# Patient Record
Sex: Female | Born: 1970 | Race: Black or African American | Hispanic: No | Marital: Single
Health system: Southern US, Community
[De-identification: ages and names within clinical notes are randomized; demographics above are authoritative.]

---

## 2010-08-05 ENCOUNTER — Emergency Department: Payer: Self-pay | Admitting: Emergency Medicine

## 2011-03-12 ENCOUNTER — Emergency Department: Payer: Self-pay | Admitting: Emergency Medicine

## 2011-03-12 LAB — URINALYSIS, COMPLETE
Bilirubin,UR: NEGATIVE
Glucose,UR: NEGATIVE mg/dL (ref 0–75)
Ketone: NEGATIVE
Leukocyte Esterase: NEGATIVE
Ph: 7 (ref 4.5–8.0)
Protein: NEGATIVE
RBC,UR: <1 /HPF (ref 0–5)
Specific Gravity: 1.008 (ref 1.003–1.030)
WBC UR: 1 /HPF (ref 0–5)

## 2011-03-12 LAB — COMPREHENSIVE METABOLIC PANEL
Albumin: 3.7 g/dL (ref 3.4–5.0)
Anion Gap: 11 (ref 7–16)
BUN: 8 mg/dL (ref 7–18)
Bilirubin,Total: 0.4 mg/dL (ref 0.2–1.0)
Chloride: 108 mmol/L — ABNORMAL HIGH (ref 98–107)
Creatinine: 0.99 mg/dL (ref 0.60–1.30)
EGFR (African American): >60
Osmolality: 287 (ref 275–301)
Potassium: 4.1 mmol/L (ref 3.5–5.1)
SGOT(AST): 15 U/L (ref 15–37)
SGPT (ALT): 23 U/L
Sodium: 145 mmol/L (ref 136–145)
Total Protein: 7.7 g/dL (ref 6.4–8.2)

## 2011-03-12 LAB — CBC
MCH: 28 pg (ref 26.0–34.0)
MCV: 85 fL (ref 80–100)
Platelet: 206 10*3/uL (ref 150–440)
RBC: 4.15 10*6/uL (ref 3.80–5.20)
RDW: 16.7 % — ABNORMAL HIGH (ref 11.5–14.5)
WBC: 7.7 10*3/uL (ref 3.6–11.0)

## 2012-06-13 ENCOUNTER — Emergency Department: Payer: Self-pay | Admitting: Emergency Medicine

## 2012-06-13 LAB — BASIC METABOLIC PANEL
Anion Gap: 7 (ref 7–16)
Calcium, Total: 8.6 mg/dL (ref 8.5–10.1)
Chloride: 109 mmol/L — ABNORMAL HIGH (ref 98–107)
Creatinine: 0.81 mg/dL (ref 0.60–1.30)
Glucose: 96 mg/dL (ref 65–99)
Potassium: 3.4 mmol/L — ABNORMAL LOW (ref 3.5–5.1)
Sodium: 141 mmol/L (ref 136–145)

## 2012-06-13 LAB — CBC
HCT: 31.2 % — ABNORMAL LOW (ref 35.0–47.0)
HGB: 10.1 g/dL — ABNORMAL LOW (ref 12.0–16.0)
MCHC: 32.2 g/dL (ref 32.0–36.0)
MCV: 76 fL — ABNORMAL LOW (ref 80–100)
Platelet: 179 10*3/uL (ref 150–440)

## 2012-06-13 LAB — TROPONIN I: Troponin-I: <0.02 ng/mL

## 2012-06-14 LAB — HCG, QUANTITATIVE, PREGNANCY: Beta Hcg, Quant.: <1 m[IU]/mL — ABNORMAL LOW

## 2014-05-13 NOTE — Consult Note (Signed)
Brief Consult Note: Diagnosis: Abdominal pain.   Patient was seen by consultant.   Consult note dictated.   Discussed with Attending MD.   Comments: Pt with bloating and pain that started day of D&C (one week ago).  Has also been very constipated.  She had vaginal bleeding up until yesterday.  She has been afebrile without abnormal vaginal discharge. Distribution of pain is bilateral flanks and suprapubic area with left >> right.  Workup in ED shows no fever, normal WBC count, normal UA. U/S concerning for either retained products of conception or hemorrhagic material.  My estimation is that she may have some retained products of conception and possible hemorrhagic material.  However, neither of these appear to be the source of her pain based on my exam and history.  She, therefore, doesn't warrant an emergency surgery to remove any intrauterine contents.  Recommend obtaining a quantitative beta hCG for baseline and a course of methergine 0.2mg  q 6 hours for 48 hours.  I personally discussed a follow up plan with her for her D&C; including seeing a gynecologist at the beginning of next week or even today (Friday) who can follow her quantitative hCG until it goes away or who can perform a follow up D&C to remove any retained products of conception before they become infected or cause other issues.  I personally conveyed this information to Dr. Manson PasseyBrown, attending in the ER.  Electronic Signatures: Conard NovakJackson, Milly Goggins D (MD)  (Signed [id number]-Feb-13 03:32)  Authored: Brief Consult Note   Last Updated: 22-Feb-13 03:32 by Conard NovakJackson, Lessly Stigler D (MD)

## 2014-07-11 IMAGING — CR DG CHEST 2V
1 series · 2 of 2 positions shown · non-contrast
Comparison: none

REASON FOR EXAM: Chest Pain
COMMENTS:

PROCEDURE:     DXR - DXR CHEST PA (OR AP) AND LATERAL  - June 13, 2012  [DATE]
RESULT:     Comparison: None.

[Series 1: pa · 0.17mm/px · 2 of 2 slices shown]
[im 1/2]
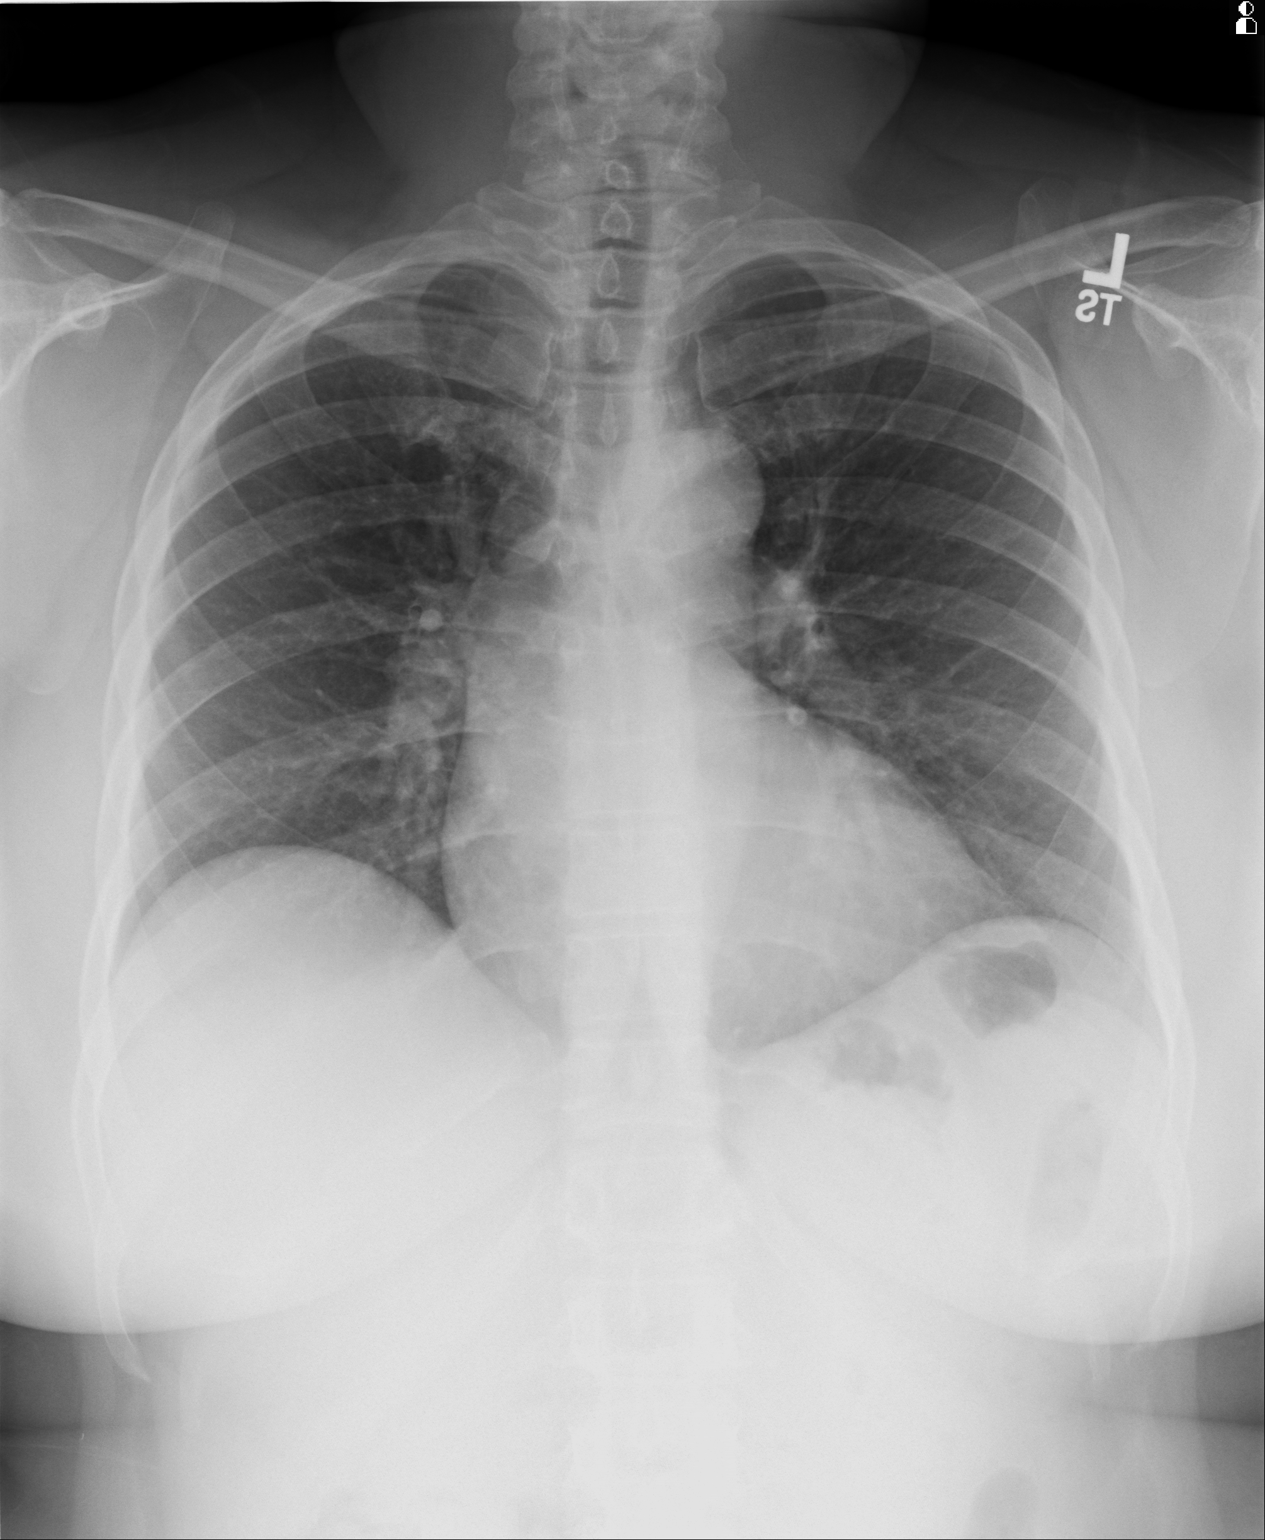
[im 2/2]
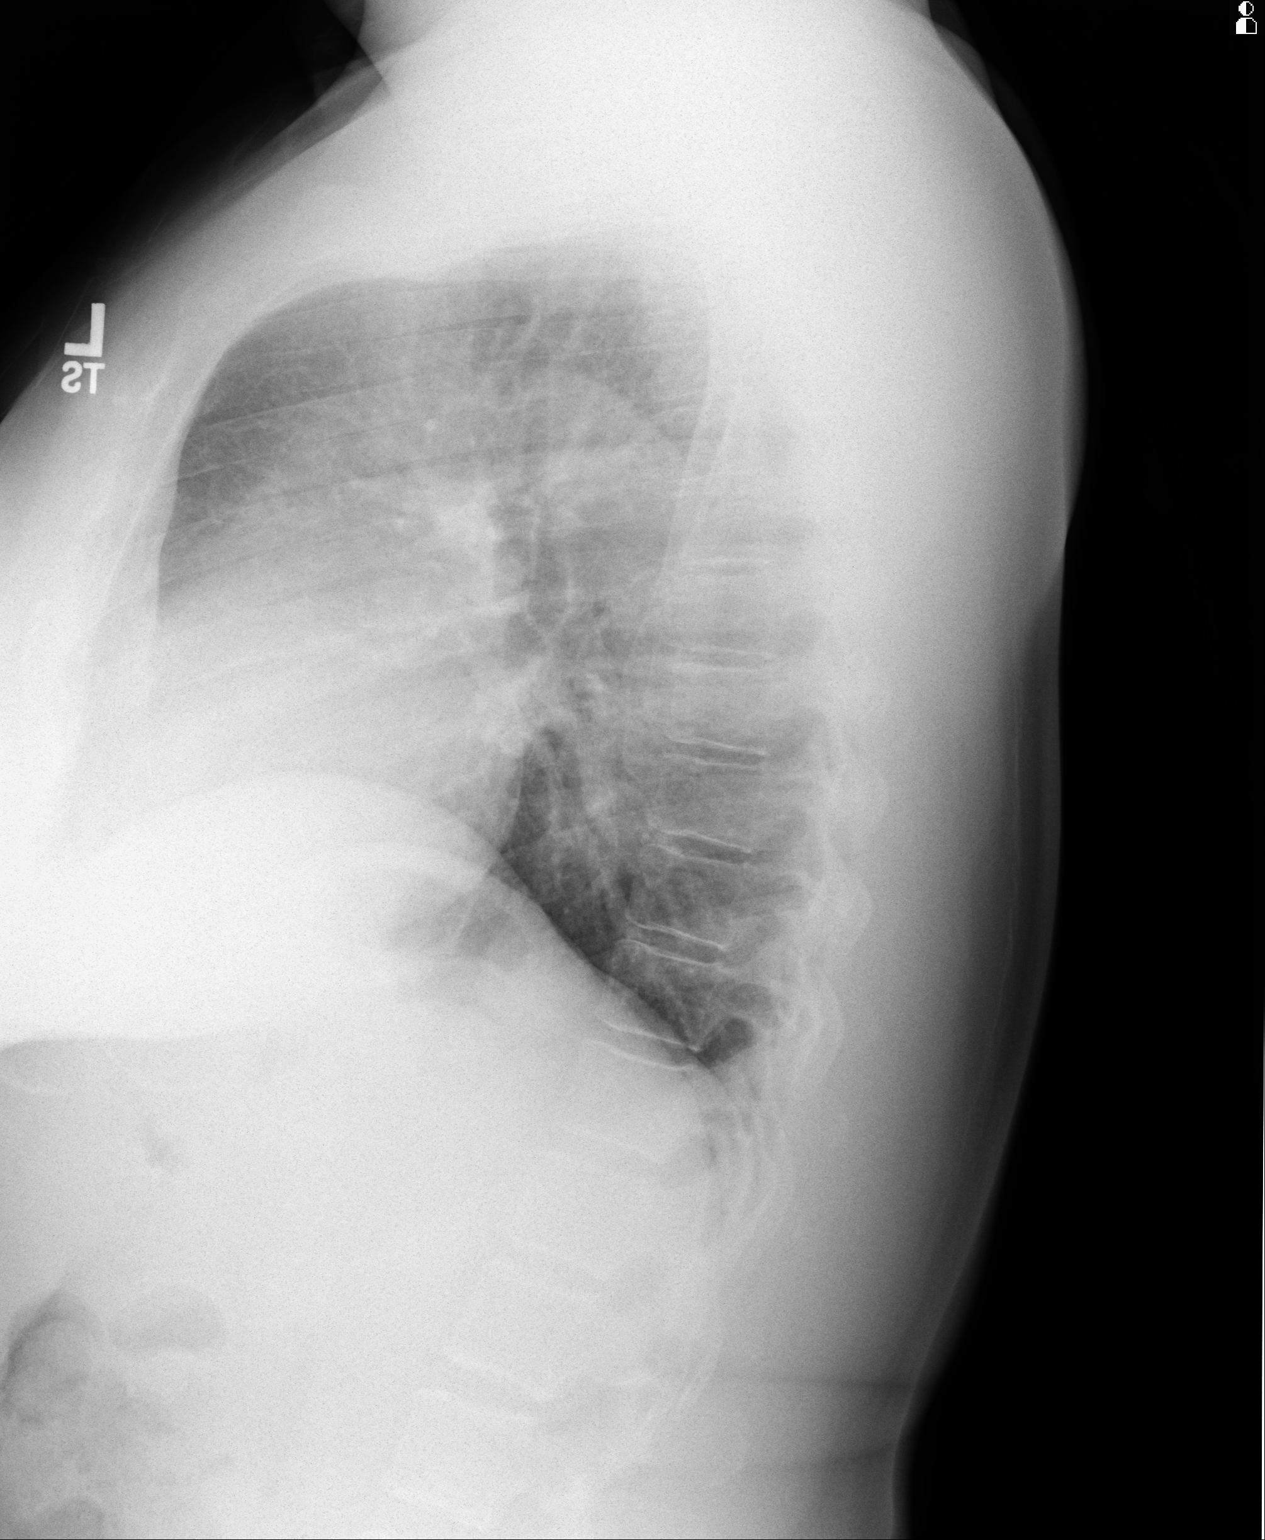

[2 of 2 positions shown; findings below may reference images not displayed]

FINDINGS: The heart and mediastinum are within normal limits given the slightly
diminished lung volumes. No focal pulmonary opacities.
IMPRESSION: No acute cardiopulmonary disease.

[REDACTED]

## 2014-07-12 IMAGING — US ABDOMEN ULTRASOUND LIMITED
1 series · 14 of 25 positions shown · non-contrast
Comparison: none

REASON FOR EXAM: RUQ pain nausea
COMMENTS:   Body Site: GB and Fossa, CBD, Head of Pancreas

PROCEDURE:     US  - US ABDOMEN LIMITED SURVEY  - June 14, 2012  [DATE]
RESULT:     History: Right upper quadrant pain.
Comparison Study: No prior.

[Series 1: abdomen ultrasound limited · 0.28mm/px · 14 of 65 slices shown]
[im 1/65]
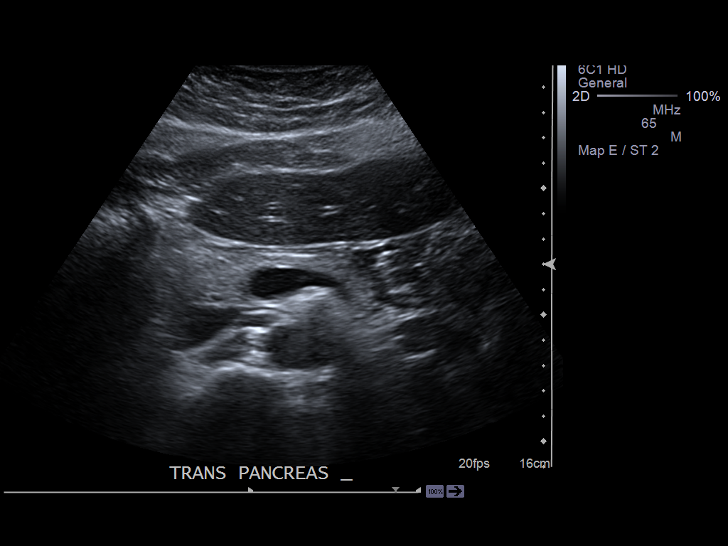
[im 6/65]
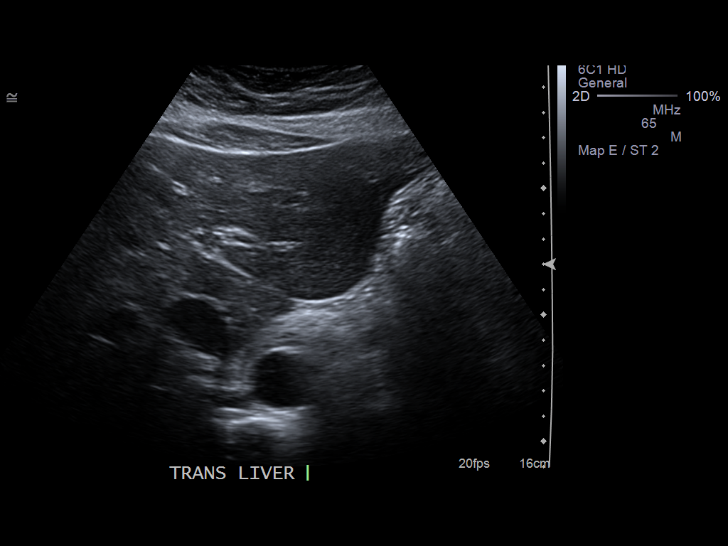
[im 11/65]
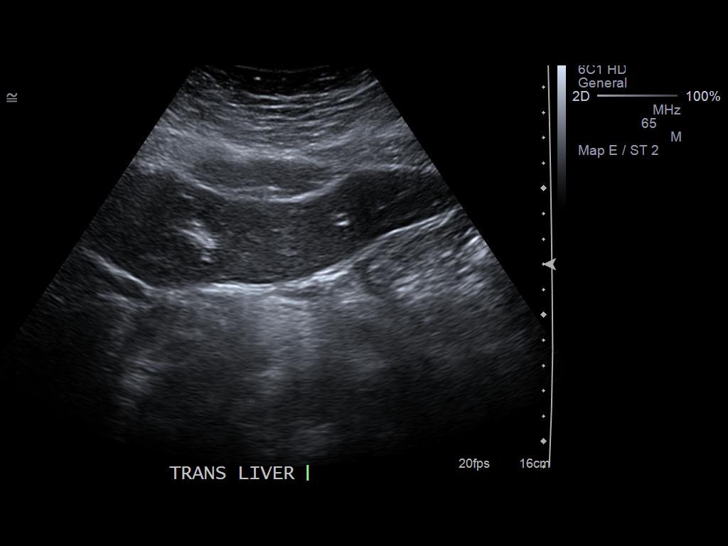
[im 17/65]
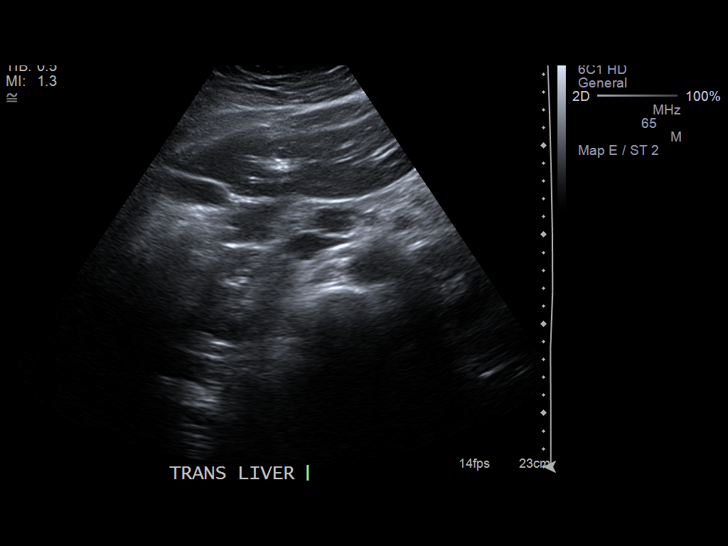
[im 22/65]
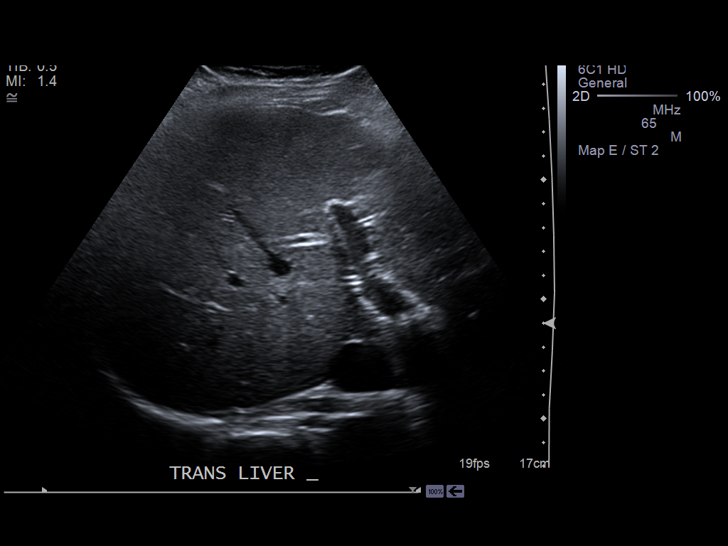
[im 25/65]
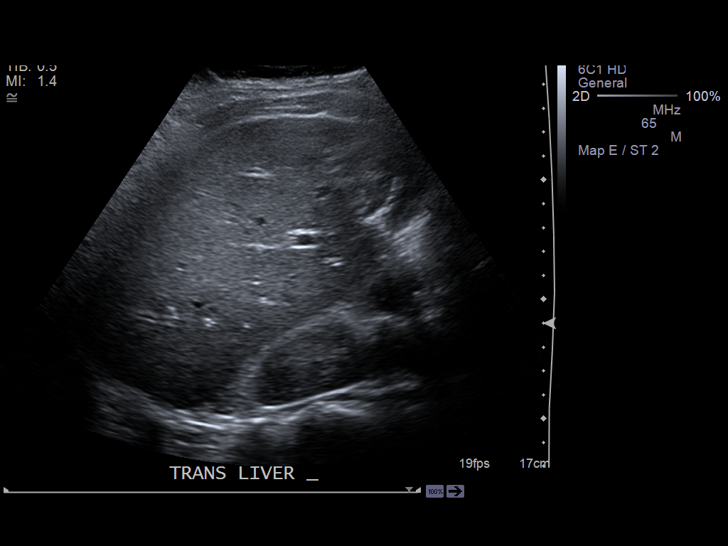
[im 30/65]
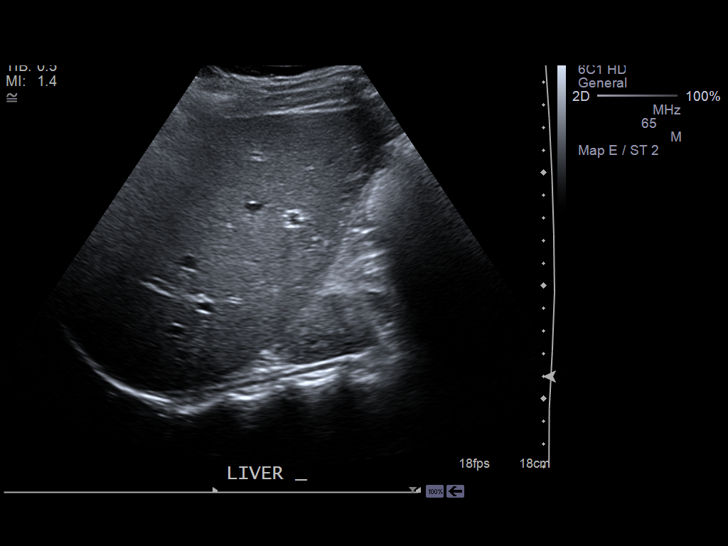
[im 35/65]
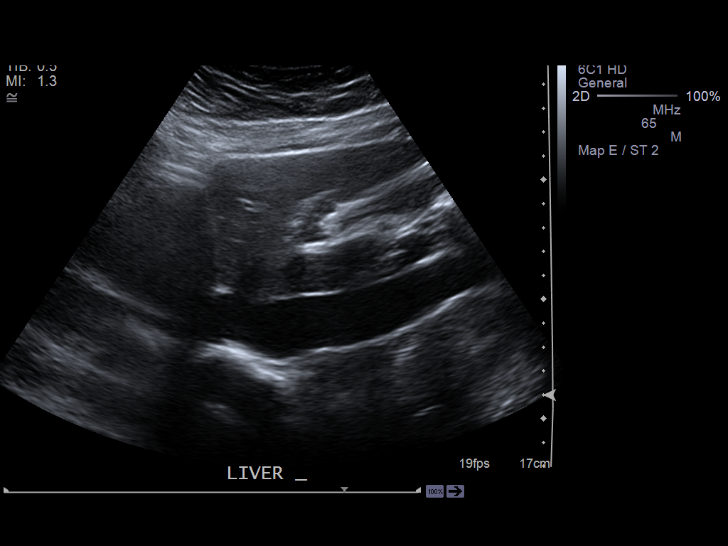
[im 41/65]
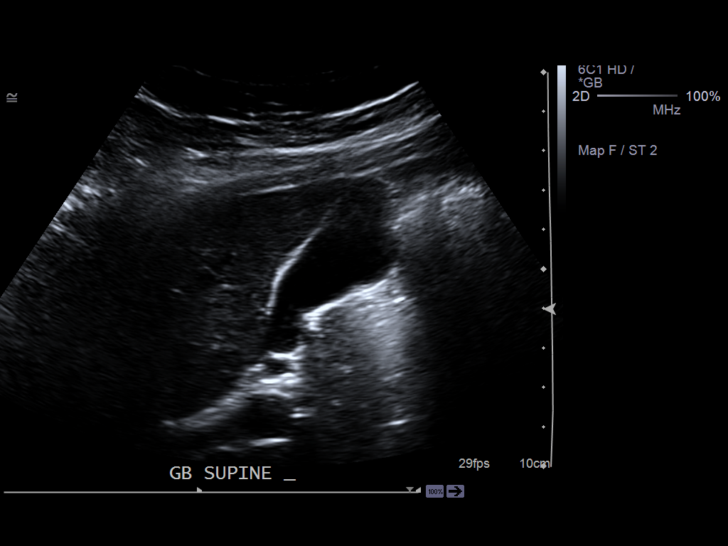
[im 43/65]
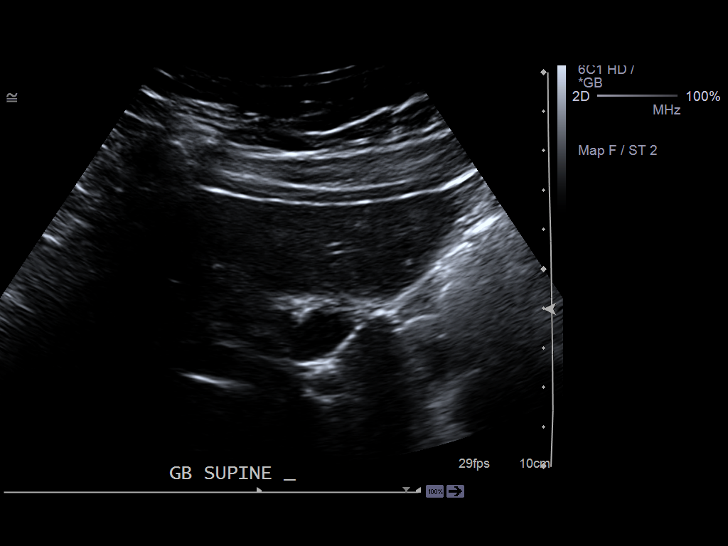
[im 49/65]
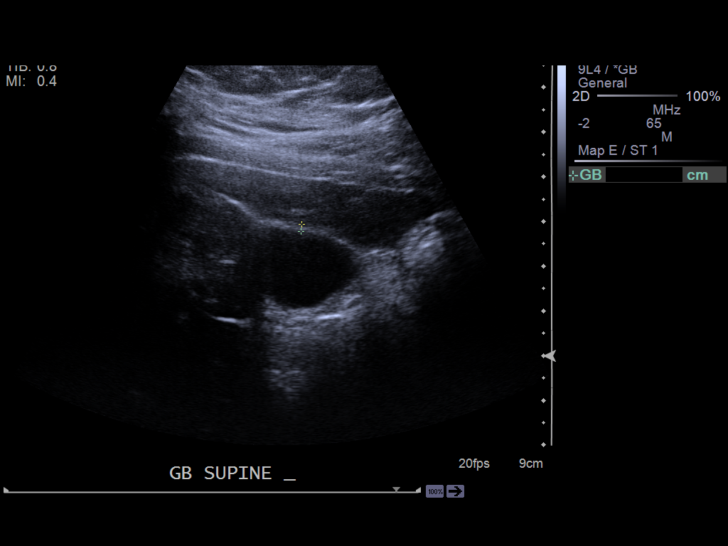
[im 54/65]
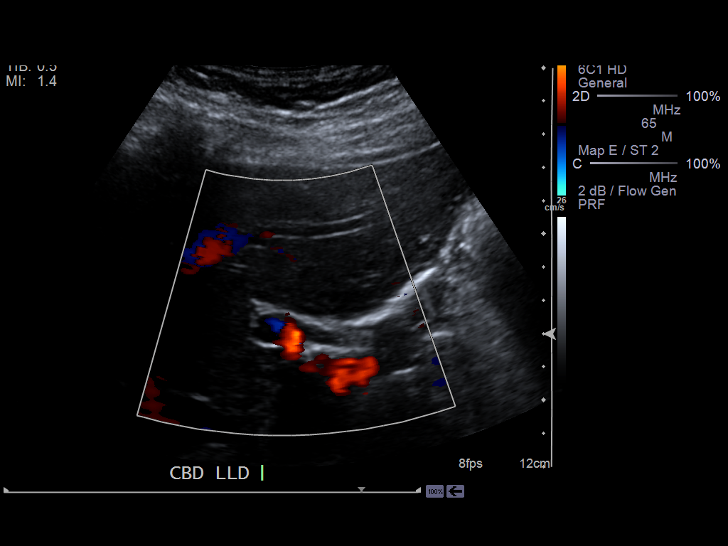
[im 59/65]
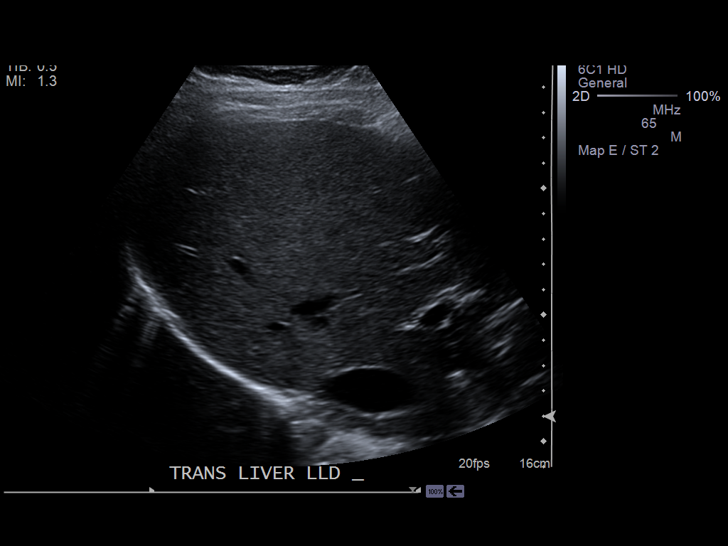
[im 65/65]
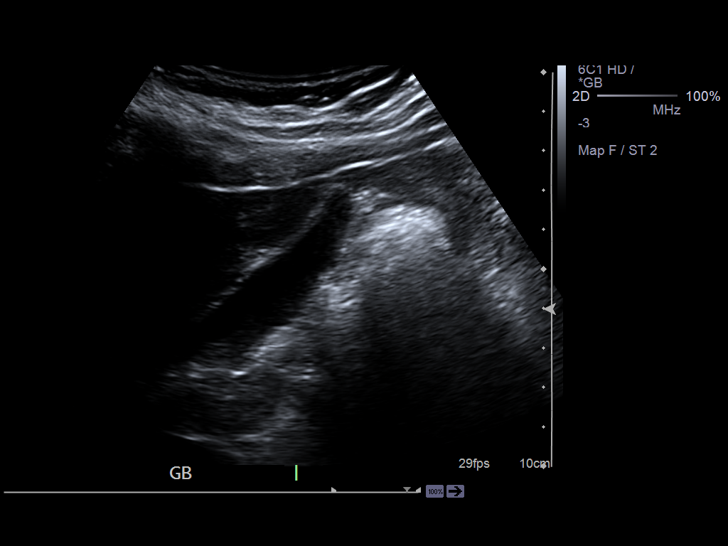

[14 of 25 positions shown; findings below may reference images not displayed]

FINDINGS: Liver is normal. Gallbladder is normal. Gallbladder wall thickness
is normal. Next sonographic Murphy sign. Common bile duct normal in caliber
4.9 mm. Limited visualization of pancreas due to overlying bowel gas. No
free fluid.
IMPRESSION: No acute abnormality.
